# Patient Record
Sex: Female | Born: 1965 | Race: White | Hispanic: No | Marital: Married
Health system: Southern US, Community
[De-identification: ages and names within clinical notes are randomized; demographics above are authoritative.]

---

## 2003-02-16 ENCOUNTER — Other Ambulatory Visit: Admission: RE | Admit: 2003-02-16 | Discharge: 2003-02-16 | Payer: Self-pay | Admitting: Obstetrics and Gynecology

## 2006-01-20 ENCOUNTER — Ambulatory Visit (HOSPITAL_COMMUNITY): Admission: RE | Admit: 2006-01-20 | Discharge: 2006-01-20 | Payer: Self-pay | Admitting: Obstetrics and Gynecology

## 2007-01-27 ENCOUNTER — Ambulatory Visit (HOSPITAL_COMMUNITY): Admission: RE | Admit: 2007-01-27 | Discharge: 2007-01-27 | Payer: Self-pay | Admitting: Internal Medicine

## 2008-02-18 ENCOUNTER — Ambulatory Visit (HOSPITAL_COMMUNITY): Admission: RE | Admit: 2008-02-18 | Discharge: 2008-02-18 | Payer: Self-pay | Admitting: Internal Medicine

## 2009-02-23 ENCOUNTER — Ambulatory Visit (HOSPITAL_COMMUNITY): Admission: RE | Admit: 2009-02-23 | Discharge: 2009-02-23 | Payer: Self-pay | Admitting: Internal Medicine

## 2010-05-15 ENCOUNTER — Ambulatory Visit (HOSPITAL_COMMUNITY)
Admission: RE | Admit: 2010-05-15 | Discharge: 2010-05-15 | Payer: Self-pay | Source: Home / Self Care | Attending: Internal Medicine | Admitting: Internal Medicine

## 2011-05-09 ENCOUNTER — Other Ambulatory Visit (HOSPITAL_COMMUNITY): Payer: Self-pay | Admitting: Internal Medicine

## 2011-05-09 DIAGNOSIS — Z1231 Encounter for screening mammogram for malignant neoplasm of breast: Secondary | ICD-10-CM

## 2011-06-18 ENCOUNTER — Ambulatory Visit (HOSPITAL_COMMUNITY)
Admission: RE | Admit: 2011-06-18 | Discharge: 2011-06-18 | Disposition: A | Discharge status: Home / Self Care | Payer: BC Managed Care – PPO | Source: Ambulatory Visit | Attending: Internal Medicine | Admitting: Internal Medicine

## 2011-06-18 DIAGNOSIS — Z1231 Encounter for screening mammogram for malignant neoplasm of breast: Secondary | ICD-10-CM | POA: Insufficient documentation

## 2012-07-13 ENCOUNTER — Other Ambulatory Visit (HOSPITAL_COMMUNITY): Payer: Self-pay | Admitting: Internal Medicine

## 2012-07-13 DIAGNOSIS — Z1231 Encounter for screening mammogram for malignant neoplasm of breast: Secondary | ICD-10-CM

## 2012-07-21 ENCOUNTER — Ambulatory Visit (HOSPITAL_COMMUNITY)
Admission: RE | Admit: 2012-07-21 | Discharge: 2012-07-21 | Disposition: A | Discharge status: Home / Self Care | Payer: BC Managed Care – PPO | Source: Ambulatory Visit | Attending: Internal Medicine | Admitting: Internal Medicine

## 2012-07-21 DIAGNOSIS — Z1231 Encounter for screening mammogram for malignant neoplasm of breast: Secondary | ICD-10-CM | POA: Insufficient documentation

## 2013-07-09 ENCOUNTER — Other Ambulatory Visit: Payer: Self-pay | Admitting: Family Medicine

## 2013-07-09 DIAGNOSIS — R42 Dizziness and giddiness: Secondary | ICD-10-CM

## 2013-07-12 ENCOUNTER — Other Ambulatory Visit (HOSPITAL_COMMUNITY): Payer: Self-pay | Admitting: Family Medicine

## 2013-07-12 DIAGNOSIS — Z1231 Encounter for screening mammogram for malignant neoplasm of breast: Secondary | ICD-10-CM

## 2013-07-16 ENCOUNTER — Ambulatory Visit
Admission: RE | Admit: 2013-07-16 | Discharge: 2013-07-16 | Disposition: A | Discharge status: Home / Self Care | Payer: BC Managed Care – PPO | Source: Ambulatory Visit | Attending: Family Medicine | Admitting: Family Medicine

## 2013-07-16 DIAGNOSIS — R42 Dizziness and giddiness: Secondary | ICD-10-CM

## 2013-07-27 ENCOUNTER — Ambulatory Visit (HOSPITAL_COMMUNITY)
Admission: RE | Admit: 2013-07-27 | Discharge: 2013-07-27 | Disposition: A | Discharge status: Home / Self Care | Payer: BC Managed Care – PPO | Source: Ambulatory Visit | Attending: Family Medicine | Admitting: Family Medicine

## 2013-07-27 DIAGNOSIS — Z1231 Encounter for screening mammogram for malignant neoplasm of breast: Secondary | ICD-10-CM

## 2015-07-23 IMAGING — US US CAROTID DUPLEX BILAT
1 series · 13 of 24 positions shown · non-contrast
Comparison: None.

CLINICAL DATA: Dizziness with positional changes.

EXAM:
BILATERAL CAROTID DUPLEX ULTRASOUND
TECHNIQUE: Gray scale imaging, color Doppler and duplex ultrasound were
performed of bilateral carotid and vertebral arteries in the neck.

[Series 1: us carotid duplex bilat · 0.08mm/px · 13 of 58 slices shown]
[im 1/58]
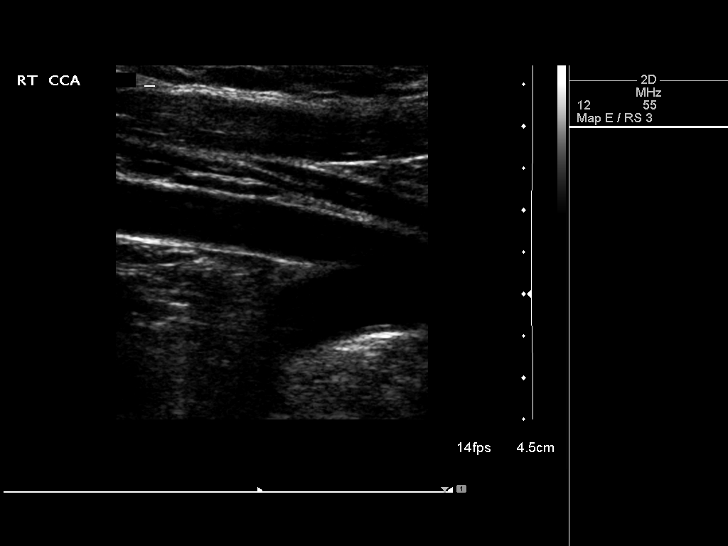
[im 5/58]
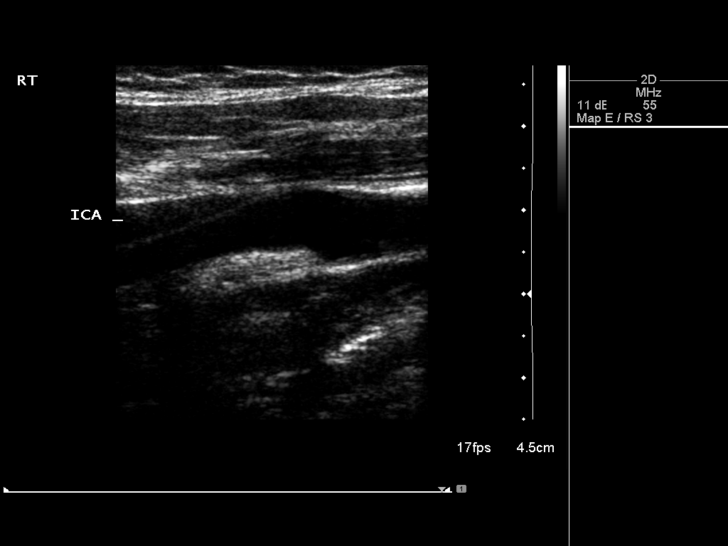
[im 10/58]
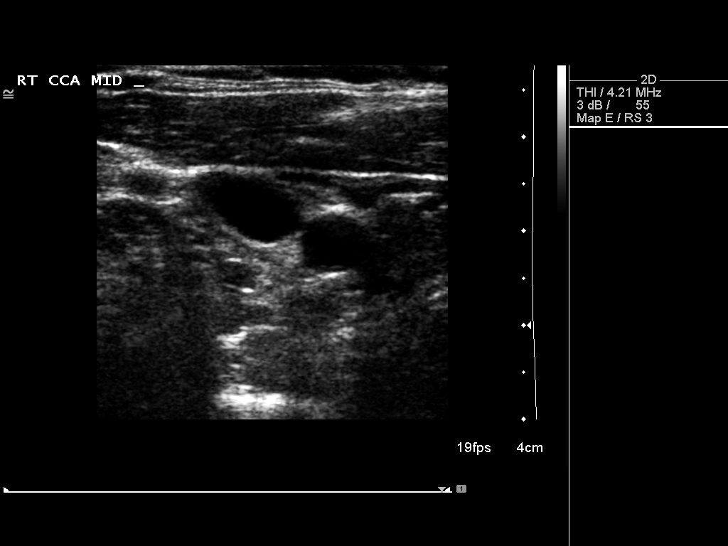
[im 15/58]
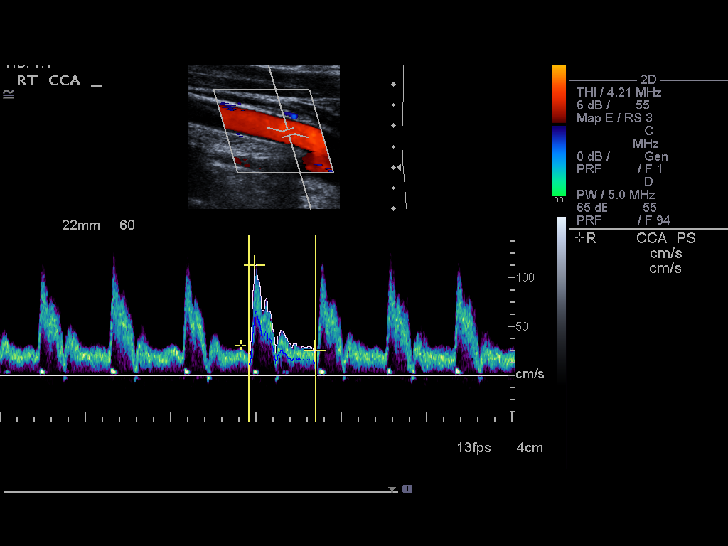
[im 20/58]
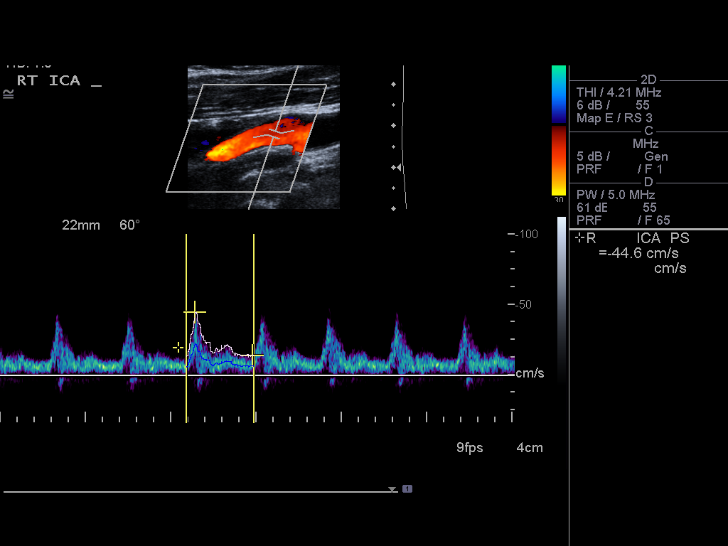
[im 25/58]
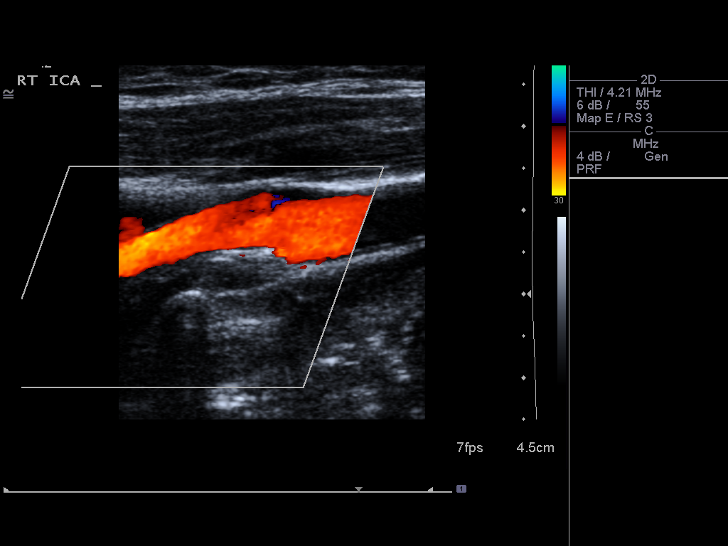
[im 30/58]
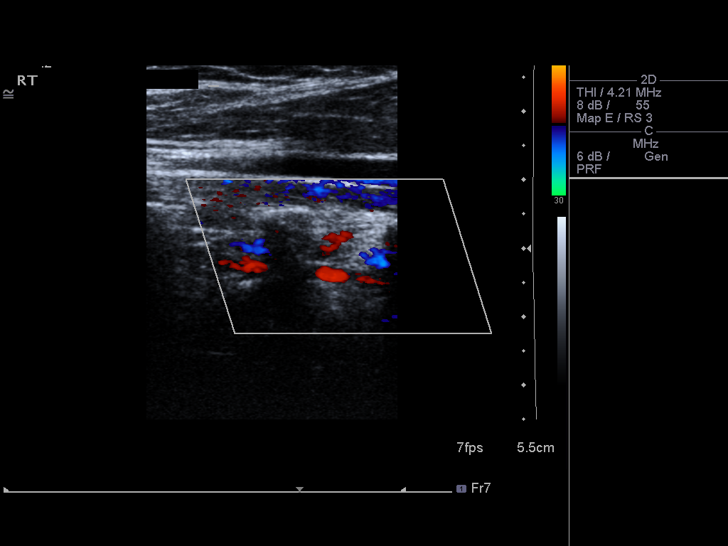
[im 33/58]
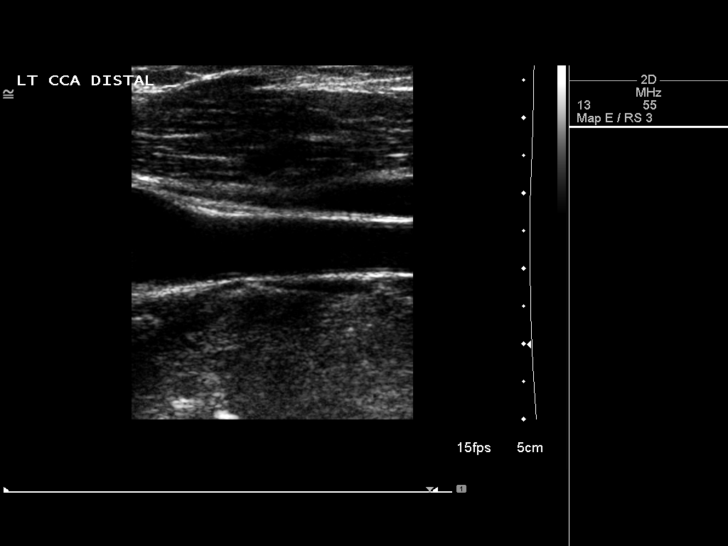
[im 38/58]
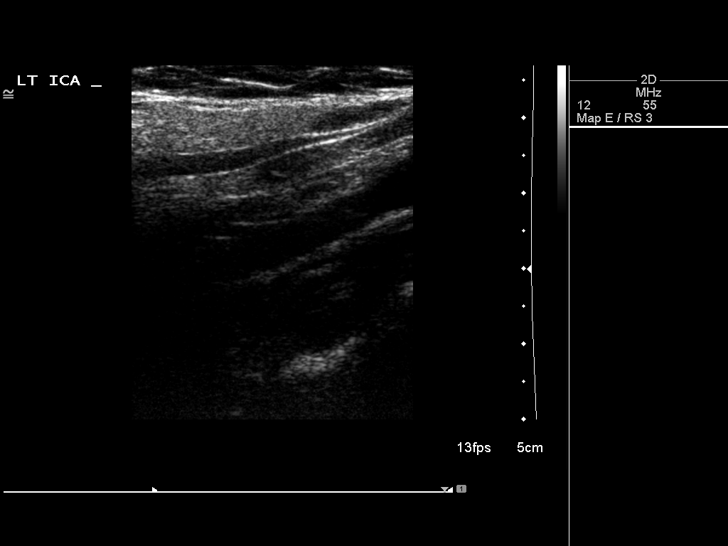
[im 43/58]
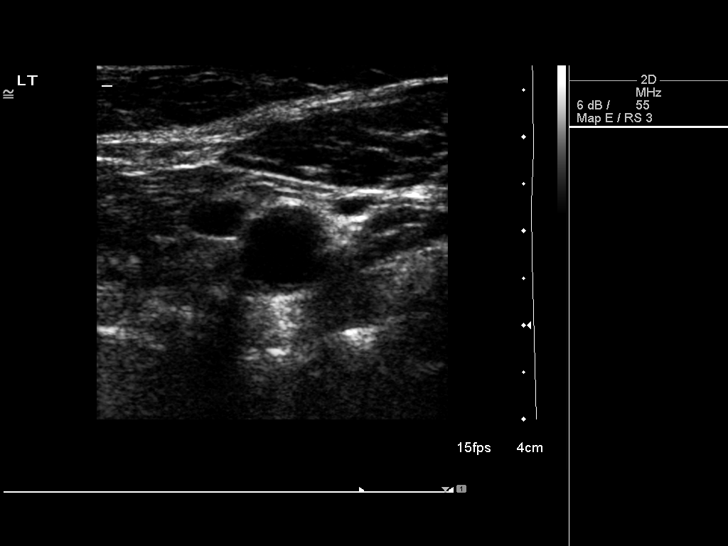
[im 48/58]
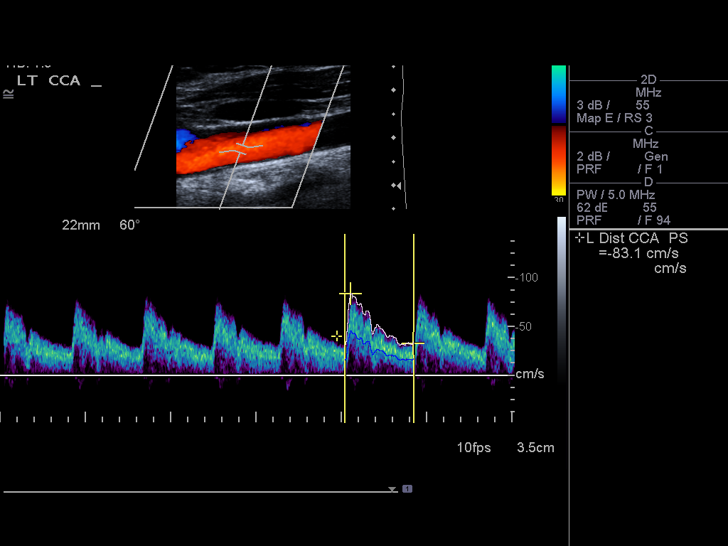
[im 53/58]
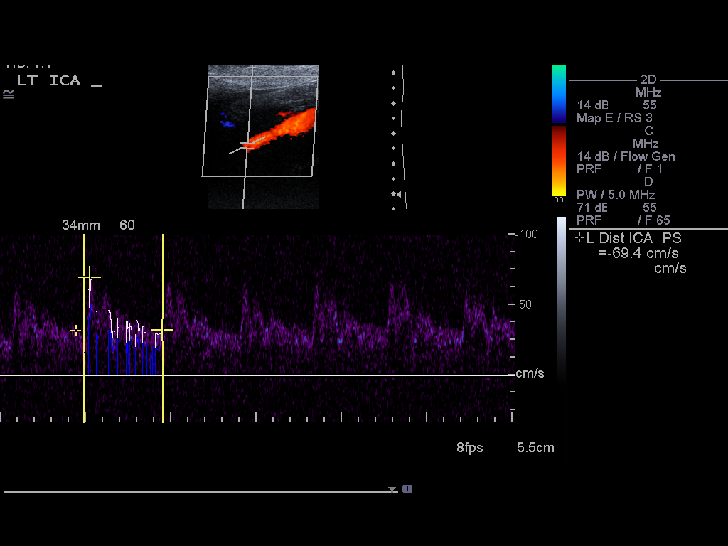
[im 58/58]
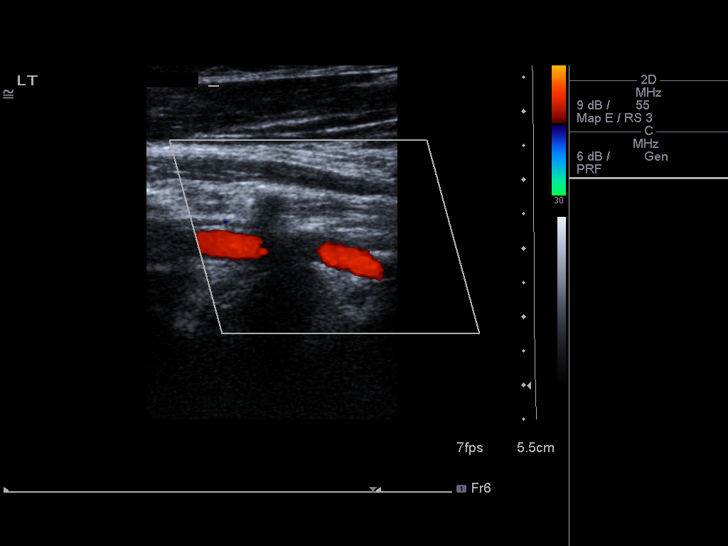

[13 of 24 positions shown; findings below may reference images not displayed]

FINDINGS: Criteria: Quantification of carotid stenosis is based on velocity
parameters that correlate the residual internal carotid diameter
with NASCET-based stenosis levels, using the diameter of the distal
internal carotid lumen as the denominator for stenosis measurement.

The following velocity measurements were obtained:

RIGHT

ICA:  75 cm/sec

CCA:  112 cm/sec

SYSTOLIC ICA/CCA RATIO:

DIASTOLIC ICA/CCA RATIO:

ECA:  101 cm/sec

LEFT

ICA:  69 cm/sec

CCA:  119 cm/sec

SYSTOLIC ICA/CCA RATIO:

DIASTOLIC ICA/CCA RATIO:

ECA:  68 cm/sec

RIGHT CAROTID ARTERY: The right carotid arteries are patent without
evidence for stenosis or significant plaque. Normal waveforms and
velocities in the right internal carotid artery. Prominent right
submandibular lymph node measuring 1.1 cm in the short axis is
nonspecific.

RIGHT VERTEBRAL ARTERY: Antegrade flow and normal waveform in the
right vertebral artery.

LEFT CAROTID ARTERY: Left carotid arteries are patent without
significant plaque or stenosis.

LEFT VERTEBRAL ARTERY: Antegrade flow and normal waveform in the
left vertebral artery.
IMPRESSION: No evidence for carotid artery stenosis. No significant
atherosclerotic plaque.

Prominent right submandibular lymph node as described.
